# Patient Record
Sex: Male | Born: 1980 | Hispanic: Yes | Marital: Single | Smoking: Never smoker
Health system: Southern US, Community
[De-identification: ages and names within clinical notes are randomized; demographics above are authoritative.]

---

## 2017-03-30 ENCOUNTER — Emergency Department (HOSPITAL_BASED_OUTPATIENT_CLINIC_OR_DEPARTMENT_OTHER)
Admission: EM | Admit: 2017-03-30 | Discharge: 2017-03-30 | Disposition: A | Discharge status: Home / Self Care | Payer: Self-pay | Attending: Emergency Medicine | Admitting: Emergency Medicine

## 2017-03-30 ENCOUNTER — Emergency Department (HOSPITAL_BASED_OUTPATIENT_CLINIC_OR_DEPARTMENT_OTHER): Payer: Self-pay

## 2017-03-30 ENCOUNTER — Encounter (HOSPITAL_BASED_OUTPATIENT_CLINIC_OR_DEPARTMENT_OTHER): Payer: Self-pay

## 2017-03-30 DIAGNOSIS — R42 Dizziness and giddiness: Secondary | ICD-10-CM

## 2017-03-30 DIAGNOSIS — R072 Precordial pain: Secondary | ICD-10-CM | POA: Insufficient documentation

## 2017-03-30 DIAGNOSIS — H81399 Other peripheral vertigo, unspecified ear: Secondary | ICD-10-CM | POA: Insufficient documentation

## 2017-03-30 LAB — URINALYSIS, ROUTINE W REFLEX MICROSCOPIC
BILIRUBIN URINE: NEGATIVE
Glucose, UA: NEGATIVE mg/dL
Hgb urine dipstick: NEGATIVE
Ketones, ur: NEGATIVE mg/dL
LEUKOCYTES UA: NEGATIVE
NITRITE: NEGATIVE
PROTEIN: NEGATIVE mg/dL
Specific Gravity, Urine: <1.005 — ABNORMAL LOW (ref 1.005–1.030)
pH: 6.5 (ref 5.0–8.0)

## 2017-03-30 LAB — CBC
HCT: 44 % (ref 39.0–52.0)
Hemoglobin: 15.6 g/dL (ref 13.0–17.0)
MCH: 29.6 pg (ref 26.0–34.0)
MCHC: 35.5 g/dL (ref 30.0–36.0)
MCV: 83.5 fL (ref 78.0–100.0)
Platelets: 273 10*3/uL (ref 150–400)
RBC: 5.27 MIL/uL (ref 4.22–5.81)
RDW: 12.5 % (ref 11.5–15.5)
WBC: 5.8 10*3/uL (ref 4.0–10.5)

## 2017-03-30 LAB — BASIC METABOLIC PANEL
ANION GAP: 8 (ref 5–15)
BUN: 15 mg/dL (ref 6–20)
CALCIUM: 8.9 mg/dL (ref 8.9–10.3)
CO2: 28 mmol/L (ref 22–32)
Chloride: 101 mmol/L (ref 101–111)
Creatinine, Ser: 0.89 mg/dL (ref 0.61–1.24)
GFR calc Af Amer: >60 mL/min (ref >60)
GLUCOSE: 99 mg/dL (ref 65–99)
POTASSIUM: 3.8 mmol/L (ref 3.5–5.1)
SODIUM: 137 mmol/L (ref 135–145)

## 2017-03-30 LAB — TROPONIN I

## 2017-03-30 LAB — D-DIMER, QUANTITATIVE (NOT AT ARMC)

## 2017-03-30 MED ORDER — MECLIZINE HCL 12.5 MG PO TABS: 12.5000 mg | ORAL_TABLET | Freq: Three times a day (TID) | ORAL | 0 refills | Status: AC | PRN

## 2017-03-30 MED ORDER — SODIUM CHLORIDE 0.9 % IV BOLUS (SEPSIS)
1000.0000 mL | Freq: Once | INTRAVENOUS | Status: AC
  Administered 2017-03-30: 1000 mL via INTRAVENOUS

## 2017-03-30 MED ORDER — ONDANSETRON 4 MG PO TBDP: 4.0000 mg | ORAL_TABLET | Freq: Three times a day (TID) | ORAL | 0 refills | Status: AC | PRN

## 2017-03-30 MED ORDER — MECLIZINE HCL 25 MG PO TABS
25.0000 mg | ORAL_TABLET | Freq: Once | ORAL | Status: AC
  Administered 2017-03-30: 25 mg via ORAL
  Filled 2017-03-30: qty 1

## 2017-03-30 MED ORDER — ONDANSETRON HCL 4 MG/2ML IJ SOLN
4.0000 mg | Freq: Once | INTRAMUSCULAR | Status: AC
  Administered 2017-03-30: 4 mg via INTRAVENOUS
  Filled 2017-03-30: qty 2

## 2017-03-30 MED FILL — MECLIZINE 12.5 MG CAPLET: 12.5 | 33 days supply | Qty: 100 | Fill #0

## 2017-03-30 MED FILL — ONDANSETRON ODT 4 MG TABLET: 4 | 7 days supply | Qty: 20 | Fill #0

## 2017-03-30 NOTE — ED Provider Notes (Signed)
Emergency Department Provider Note   I have reviewed the triage vital signs and the nursing notes.   HISTORY  Chief Complaint Dizziness   HPI Joshua Barry is a 36 y.o. male presents to the emergency department for evaluation of intermittent vertigo over the last 5 days which is become more constant and new onset sharp chest pain over the past 2 days. Patient reports vertigo symptoms worse with head movement. Since yesterday he has had more constant symptoms. He denies any numbness or tingling in the arms or legs. Patient states that his walking has seemed slightly slower because of vertigo symptoms. He has associated nausea but no vomiting. No ringing or pain in the ears. No head trauma. 2 days ago he developed sharp chest pain in the center to the left side of his chest. Chest pain is not pleuritic or exertional. No radiation of symptoms. No similar symptoms in the past.   History reviewed. No pertinent past medical history.  There are no active problems to display for this patient.   History reviewed. No pertinent surgical history.    Allergies Patient has no known allergies.  No family history on file.  Social History Social History  Substance Use Topics  . Smoking status: Never Smoker  . Smokeless tobacco: Never Used  . Alcohol use Yes     Comment: occ    Review of Systems  Constitutional: No fever/chills Eyes: No visual changes. ENT: No sore throat. Positive vertigo.  Cardiovascular: Positive chest pain. Respiratory: Denies shortness of breath. Gastrointestinal: No abdominal pain.  No nausea, no vomiting.  No diarrhea.  No constipation. Genitourinary: Negative for dysuria. Musculoskeletal: Negative for back pain. Skin: Negative for rash. Neurological: Negative for headaches, focal weakness or numbness.  10-point ROS otherwise negative.  ____________________________________________   PHYSICAL EXAM:  VITAL SIGNS: ED Triage Vitals  Enc Vitals  Group     BP 03/30/17 1131 118/87     Pulse Rate 03/30/17 1131 65     Resp 03/30/17 1131 16     Temp 03/30/17 1131 98.3 F (36.8 C)     Temp Source 03/30/17 1131 Oral     SpO2 03/30/17 1131 99 %     Weight 03/30/17 1131 173 lb (78.5 kg)     Pain Score 03/30/17 1129 5   Constitutional: Alert and oriented. Well appearing and in no acute distress. Eyes: Conjunctivae are normal. PERRL. EOMI. No nystagmus.  Head: Atraumatic. Nose: No congestion/rhinnorhea. Mouth/Throat: Mucous membranes are slightly dry.  Neck: No stridor.  No meningeal signs.   Cardiovascular: Normal rate, regular rhythm. Good peripheral circulation. Grossly normal heart sounds.   Respiratory: Normal respiratory effort.  No retractions. Lungs CTAB. Gastrointestinal: Soft and nontender. No distention.  Musculoskeletal: No lower extremity tenderness nor edema. No gross deformities of extremities. Neurologic:  Normal speech and language. No gross focal neurologic deficits are appreciated. Normal finger-to-nose. Normal heal-to-shin. Normal gait. Negative Romberg. No CN deficits 2-12.  Skin:  Skin is warm, dry and intact. No rash noted.  ____________________________________________   LABS (all labs ordered are listed, but only abnormal results are displayed)  Labs Reviewed  URINALYSIS, ROUTINE W REFLEX MICROSCOPIC - Abnormal; Notable for the following:       Result Value   Specific Gravity, Urine <1.005 (*)    All other components within normal limits  BASIC METABOLIC PANEL  CBC  TROPONIN I  D-DIMER, QUANTITATIVE (NOT AT [facility])   ____________________________________________  EKG   EKG Interpretation  Date/Time:  Wednesday  March 30 2017 11:33:37 EDT Ventricular Rate:  58 PR Interval:  150 QRS Duration: 80 QT Interval:  408 QTC Calculation: 400 R Axis:   83 Text Interpretation:  Sinus bradycardia Otherwise normal ECG No STEMI  Confirmed by Alona Bene [phone]) on 03/30/2017 1:01:09 PM        ____________________________________________  RADIOLOGY  Dg Chest 2 View  Result Date: 03/30/2017 CLINICAL DATA:  Chest/ left arm pain, dizziness, headache EXAM: CHEST  2 VIEW COMPARISON:  None. FINDINGS: Lungs are clear.  No pleural effusion or pneumothorax. The heart is normal in size. Visualized osseous structures are within normal limits. IMPRESSION: Normal chest radiographs. Electronically Signed   By: Charline Bills M.D.   On: 03/30/2017 13:00   Ct Head Wo Contrast  Result Date: 03/30/2017 IN CLINICAL DATA: Several days with headaches, initial encounter EXAM: CT HEAD WITHOUT CONTRAST TECHNIQUE: Contiguous axial images were obtained from the base of the skull through the vertex without intravenous contrast. COMPARISON:  None. FINDINGS: Brain: No evidence of acute infarction, hemorrhage, hydrocephalus, extra-axial collection or mass lesion/mass effect. Scattered benign-appearing calcifications are noted. Vascular: No hyperdense vessel or unexpected calcification. Skull: Normal. Negative for fracture or focal lesion. Sinuses/Orbits: No acute finding. Other: None. IMPRESSION: No acute abnormality noted. Electronically Signed   By: Alcide Clever M.D.   On: 03/30/2017 13:00    ____________________________________________   PROCEDURES  Procedure(s) performed:   Procedures  None ____________________________________________   INITIAL IMPRESSION / ASSESSMENT AND PLAN / ED COURSE  Pertinent labs & imaging results that were available during my care of the patient were reviewed by me and considered in my medical decision making (see chart for details).  Patient presents emergency department for evaluation of 2 apparently distinct complaints. First is had 5 days of intermittent vertigo which seems to be becoming more constant. Second the patient is experiencing sharp, intermittent chest pain. Low suspicion for DVT/PE. Plan for d-dimer, troponin, chest x-ray along with CT head to  evaluate for any intracranial mass, bleed, or obvious old infarct with approximately 5 days of symptoms. Suspect that the patient's vertigo is peripheral in nature. No indication for emergent MRI.   On reevaluation the patient is feeling much better after Zofran, Antivert, IV fluids. He is ambulatory without difficulty. Plan for discharge with plan for symptomatic management and PCP follow-up with referral to ENT if symptoms continue or worsen.   At this time, I do not feel there is any life-threatening condition present. I have reviewed and discussed all results (EKG, imaging, lab, urine as appropriate), exam findings with patient. I have reviewed nursing notes and appropriate previous records.  I feel the patient is safe to be discharged home without further emergent workup. Discussed usual and customary return precautions. Patient and family (if present) verbalize understanding and are comfortable with this plan.  Patient will follow-up with their primary care provider. If they do not have a primary care provider, information for follow-up has been provided to them. All questions have been answered. ____________________________________________  FINAL CLINICAL IMPRESSION(S) / ED DIAGNOSES  Final diagnoses:  Peripheral vertigo, unspecified laterality  Lightheadedness  Precordial chest pain     MEDICATIONS GIVEN DURING THIS VISIT:  Medications  sodium chloride 0.9 % bolus 1,000 mL (0 mLs Intravenous Stopped 03/30/17 1332)  meclizine (ANTIVERT) tablet 25 mg (25 mg Oral Given 03/30/17 1239)  ondansetron (ZOFRAN) injection 4 mg (4 mg Intravenous Given 03/30/17 1240)     NEW OUTPATIENT MEDICATIONS STARTED DURING THIS VISIT:  Discharge Medication  List as of 03/30/2017  2:22 PM    START taking these medications   Details  meclizine (ANTIVERT) 12.5 MG tablet Take 1 tablet (12.5 mg total) by mouth 3 (three) times daily as needed for dizziness., Starting Wed 03/30/2017, Print    ondansetron  (ZOFRAN ODT) 4 MG disintegrating tablet Take 1 tablet (4 mg total) by mouth every 8 (eight) hours as needed for nausea or vomiting., Starting Wed 03/30/2017, Print        Note:  This document was prepared using Dragon voice recognition software and may include unintentional dictation errors.  Alona Bene, MD Emergency Medicine    Taraya Steward, Arlyss Repress, MD 03/30/17 [phone]

## 2017-03-30 NOTE — ED Triage Notes (Signed)
C/o dizziness x 5 days-CP x 2 days-sent from [facility]-steady gait

## 2017-03-30 NOTE — Discharge Instructions (Signed)
We believe your symptoms were caused by benign vertigo.  Please read through the included information and take any prescribed medication(s).  Follow up with your doctor as listed above.  If you develop any new or worsening symptoms that concern you, including but not limited to persistent dizziness/vertigo, numbness or weakness in your arms or legs, altered mental status, persistent vomiting, or fever greater than 101, please return immediately to the Emergency Department.  

## 2019-02-28 IMAGING — CR DG CHEST 2V
2 series · 2 of 2 positions shown · non-contrast
Comparison: None.

CLINICAL DATA: Chest/ left arm pain, dizziness, headache

EXAM:
CHEST  2 VIEW

[w chest pa]
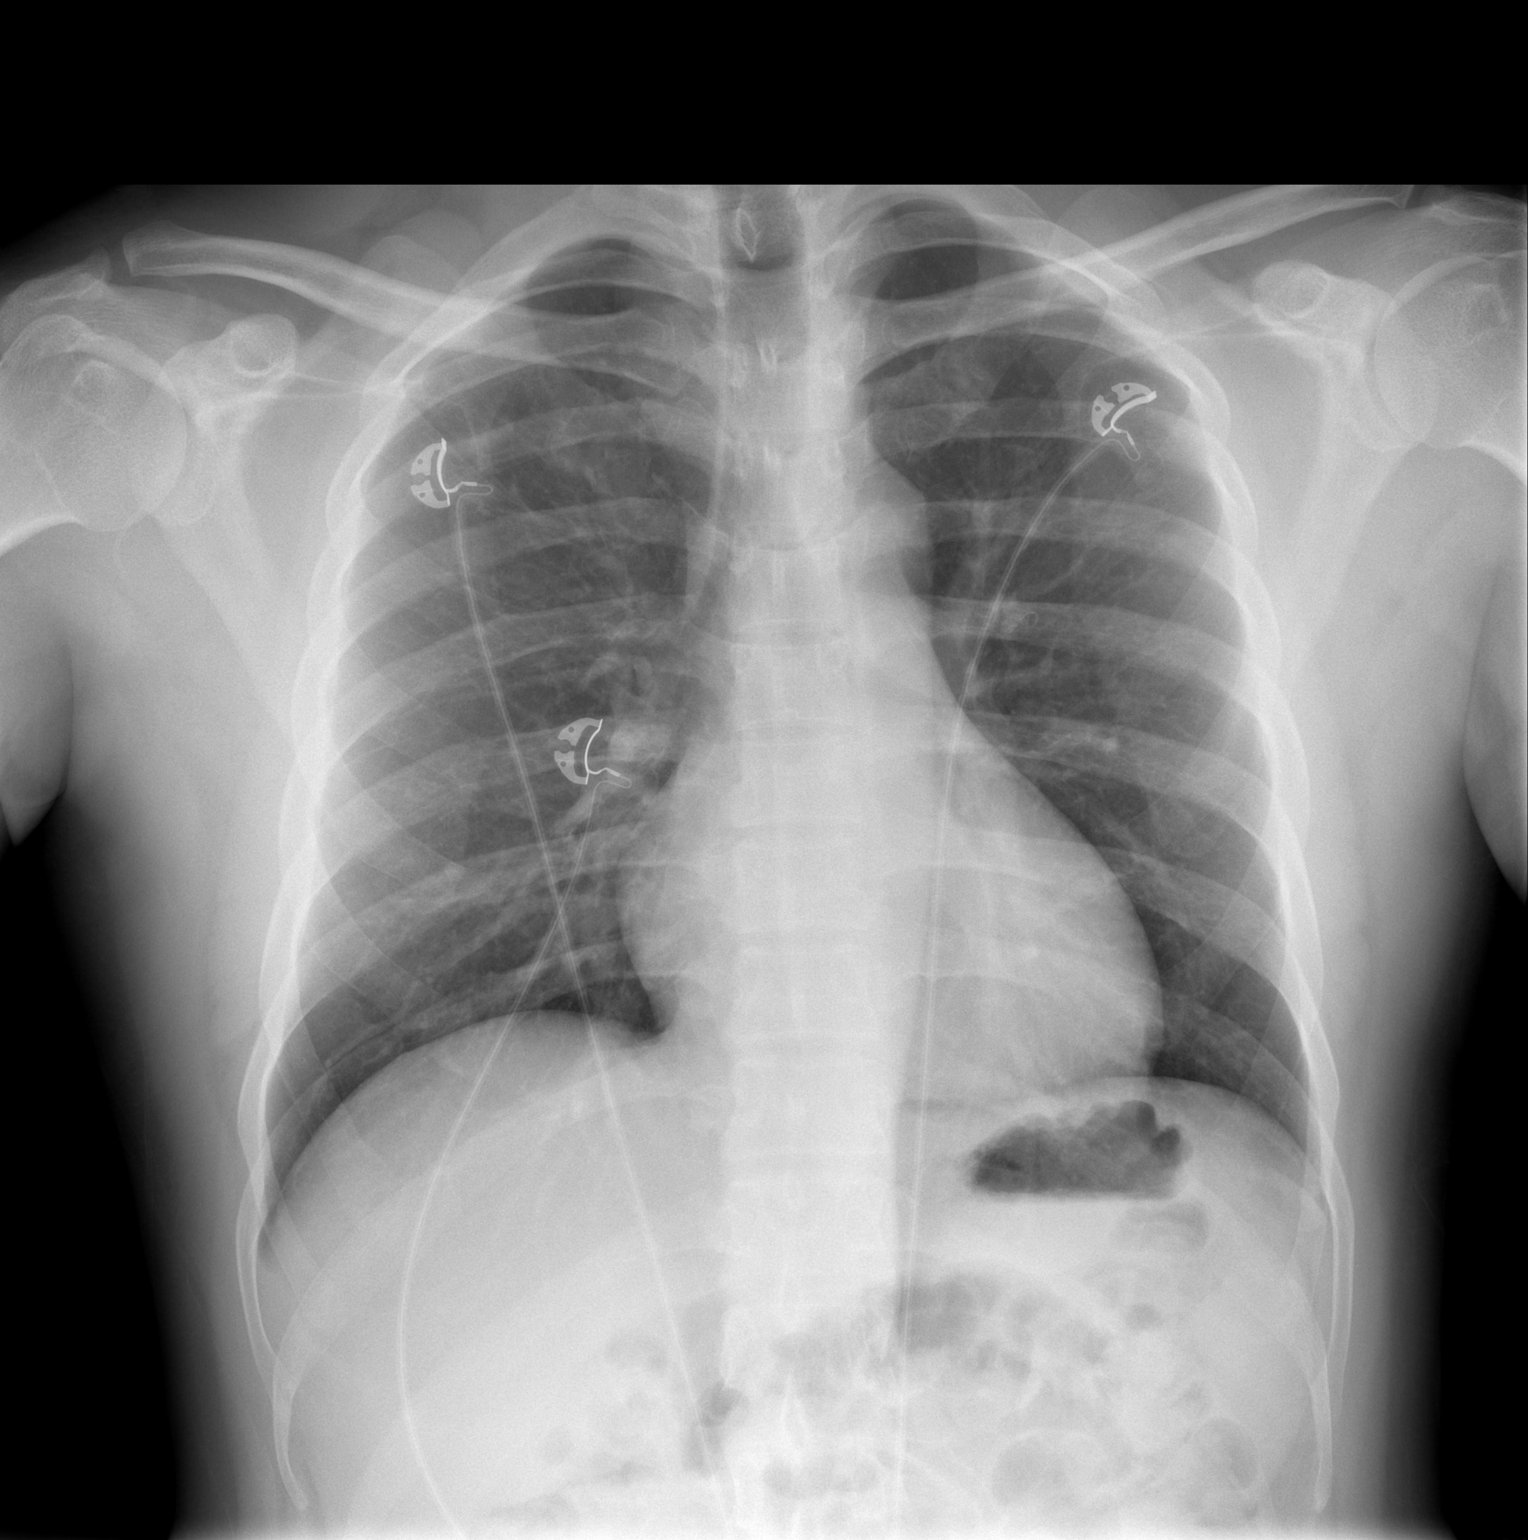

[w chest lat]
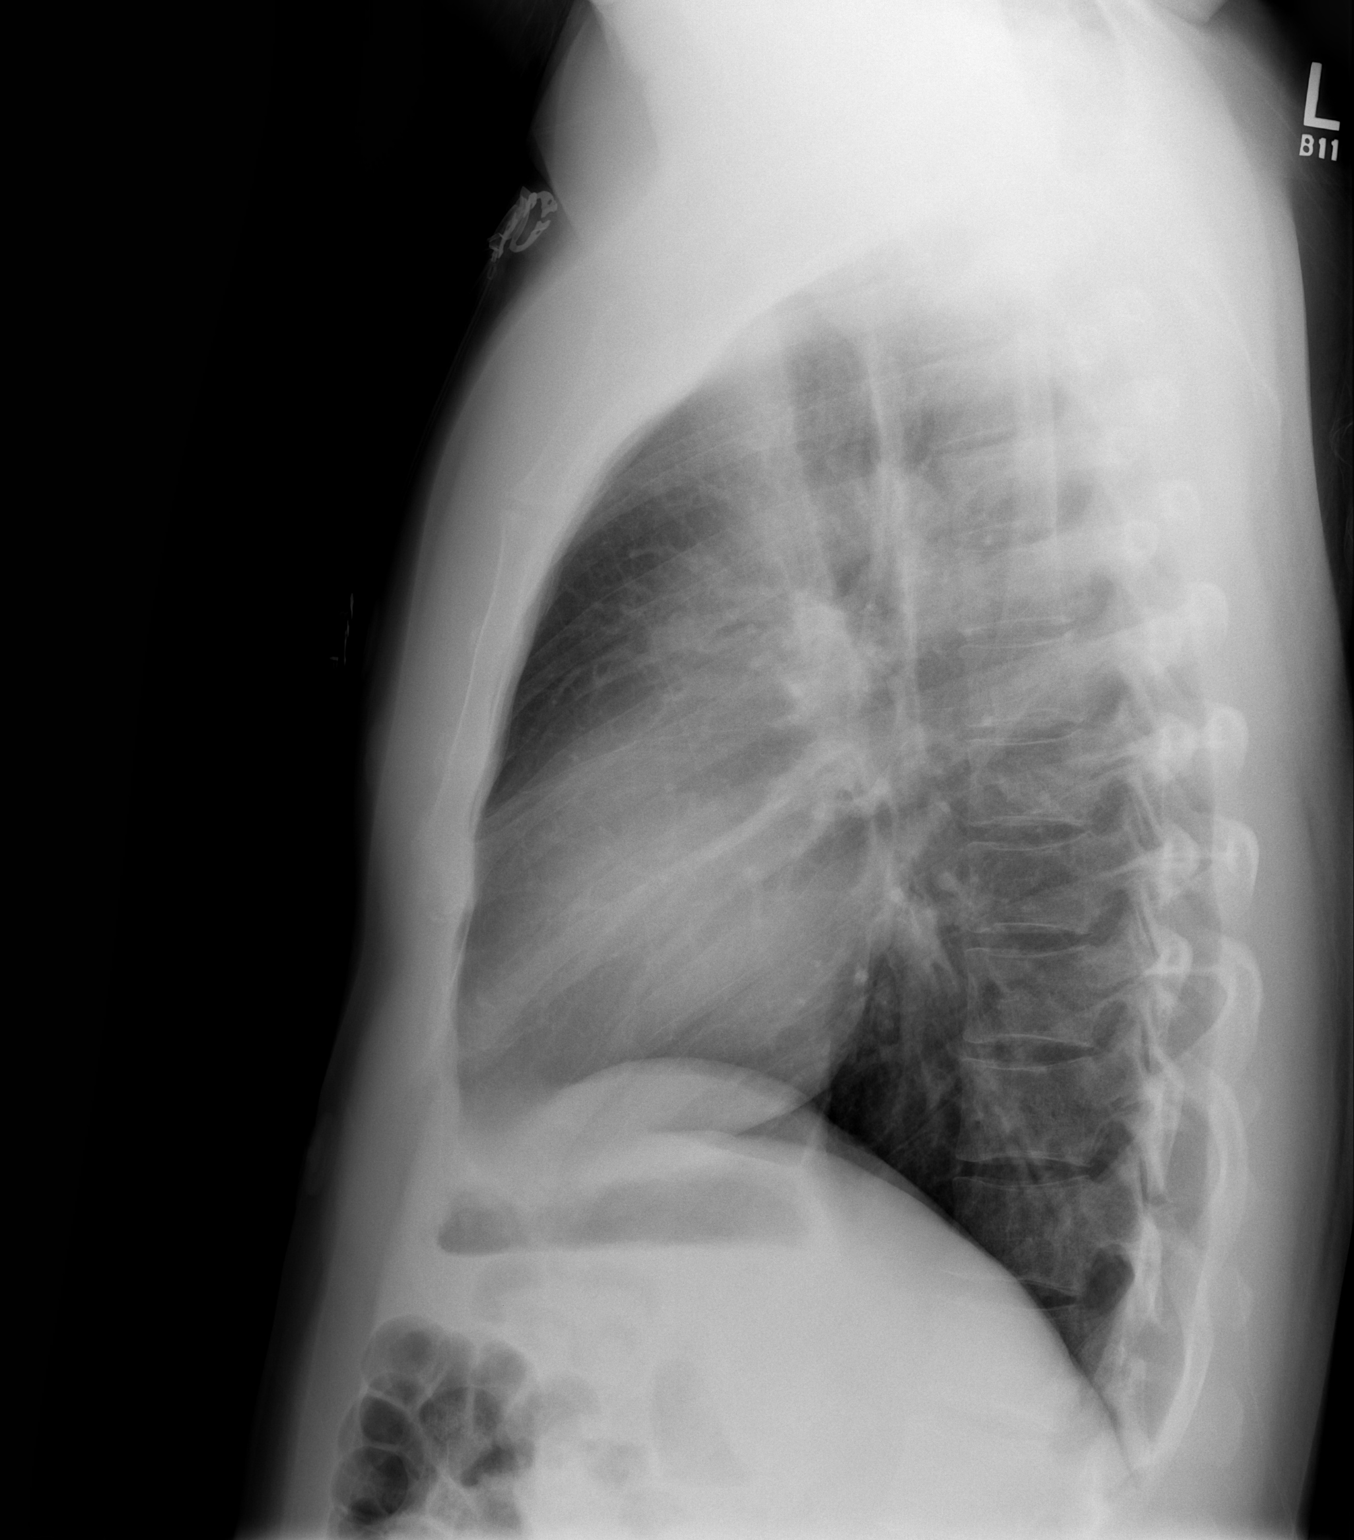

[2 of 2 positions shown; findings below may reference images not displayed]

FINDINGS: Lungs are clear.  No pleural effusion or pneumothorax.

The heart is normal in size.

Visualized osseous structures are within normal limits.
IMPRESSION: Normal chest radiographs.
